# Patient Record
Sex: Male | Born: 1980 | Race: White | Hispanic: No | Marital: Single
Health system: Southern US, Community
[De-identification: ages and names within clinical notes are randomized; demographics above are authoritative.]

---

## 2016-08-17 ENCOUNTER — Emergency Department
Admission: EM | Admit: 2016-08-17 | Discharge: 2016-08-17 | Disposition: A | Discharge status: Home / Self Care | Payer: BC Managed Care – PPO | Attending: Emergency Medicine | Admitting: Emergency Medicine

## 2016-08-17 ENCOUNTER — Emergency Department: Payer: BC Managed Care – PPO

## 2016-08-17 DIAGNOSIS — W268XXA Contact with other sharp object(s), not elsewhere classified, initial encounter: Secondary | ICD-10-CM | POA: Diagnosis not present

## 2016-08-17 DIAGNOSIS — Y9389 Activity, other specified: Secondary | ICD-10-CM | POA: Insufficient documentation

## 2016-08-17 DIAGNOSIS — Y999 Unspecified external cause status: Secondary | ICD-10-CM | POA: Diagnosis not present

## 2016-08-17 DIAGNOSIS — S61412A Laceration without foreign body of left hand, initial encounter: Secondary | ICD-10-CM | POA: Diagnosis not present

## 2016-08-17 DIAGNOSIS — Y929 Unspecified place or not applicable: Secondary | ICD-10-CM | POA: Insufficient documentation

## 2016-08-17 MED ORDER — TRAMADOL HCL 50 MG PO TABS: 50.0000 mg | ORAL_TABLET | Freq: Four times a day (QID) | ORAL | 0 refills | Status: AC | PRN

## 2016-08-17 MED ORDER — TRAMADOL HCL 50 MG PO TABS
50.0000 mg | ORAL_TABLET | Freq: Once | ORAL | Status: AC
  Administered 2016-08-17: 50 mg via ORAL
  Filled 2016-08-17: qty 1

## 2016-08-17 NOTE — ED Provider Notes (Signed)
[facility] Emergency Department Provider Note  ____________________________________________  Time seen: Approximately 7:56 PM  I have reviewed the triage vital signs and the nursing notes.   HISTORY  Chief Complaint Laceration   HPI Thomas Wilson is a 35 y.o. male that presents with laceration to left hand. A chisel cut hand earlier tonight while making a box. Patient had tetanus shot a couple months ago. Patient has full range of motion of fingers. Patient is having some numbness of left ring finger. Patient is right handed. Patient denies any other injuries.  No past medical history on file.  There are no active problems to display for this patient.   No past surgical history on file.  Prior to Admission medications   Medication Sig Start Date End Date Taking? Authorizing Provider  traMADol (ULTRAM) 50 MG tablet Take 1 tablet (50 mg total) by mouth every 6 (six) hours as needed. 08/17/16 08/17/17  Enid DerryAshley Chrissy Ealey, PA-C    Allergies Patient has no known allergies.  No family history on file.  Social History Social History  Substance Use Topics  . Smoking status: Not on file  . Smokeless tobacco: Not on file  . Alcohol use Not on file    Review of Systems  Constitutional: Negative for fever/chills Cardiovascular: No chest pain Respiratory: Negative for shortness of breath. Musculoskeletal: Positive for pain. Skin: Laceration to palm of left hand. No swelling or bruising.  ____________________________________________   PHYSICAL EXAM:  VITAL SIGNS: ED Triage Vitals  Enc Vitals Group     BP 08/17/16 1948 (!) 124/57     Pulse Rate 08/17/16 1948 64     Resp 08/17/16 1948 18     Temp 08/17/16 1948 97.9 F (36.6 C)     Temp Source 08/17/16 1948 Oral     SpO2 08/17/16 1948 100 %     Weight 08/17/16 1927 200 lb (90.7 kg)     Height 08/17/16 1927 6' (1.829 m)     Head Circumference --      Peak Flow --      Pain Score 08/17/16 1926 9      Pain Loc --      Pain Edu? --      Excl. in GC? --      Constitutional: Alert and oriented. Well appearing and in no acute distress. Eyes: Conjunctivae are normal. EOMI. Nose: No congestion/rhinnorhea. Mouth/Throat: Mucous membranes are moist.   Neck: No stridor. Cardiovascular: Good peripheral circulation. Respiratory: Normal respiratory effort.  No retractions. Musculoskeletal: FROM throughout. Neurologic:  Normal speech and language. No gross focal neurologic deficits are appreciated. Skin:  4 cm laceration to palm of left hand.   ____________________________________________   LABS (all labs ordered are listed, but only abnormal results are displayed)  Labs Reviewed - No data to display ____________________________________________  EKG    RADIOLOGY  I, Enid DerryAshley Cambria Osten, personally viewed and evaluated these images (plain radiographs) as part of my medical decision making, as well as reviewing the written report by the radiologist.   FINDINGS:  There is no evidence of acute fracture or dislocation. Bone  mineralization appears normal. Joint space widths are preserved.  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 No soft tissue  abnormality or radiopaque foreign body is identified.    IMPRESSION:  No acute osseous abnormality or radiopaque foreign body identified.      PROCEDURES  Procedure(s) performed:  LACERATION REPAIR Performed by: Enid DerryAshley Maudean Hoffmann  Consent: Verbal consent obtained.  Consent given by:  patient  Prepped and Draped in normal sterile fashion  Wound explored: No foreign bodies   Laceration Location: Palm of left hand  Laceration Length: 4 cm  Anesthesia: None  Local anesthetic: lidocaine 1% w/o epinephrine. Hospital out of lidocaine with epinephrine  Anesthetic total: 5 ml  Irrigation method: syringe  Amount of cleaning: 500ml normal saline  Skin closure: 4-0 nylon  Number of sutures: 8  Technique: Simple  interrupted  Patient tolerance: Patient tolerated the procedure well with no immediate complications.     INITIAL IMPRESSION / ASSESSMENT AND PLAN / ED COURSE  Clinical Course     Pertinent labs & imaging results that were available during my care of the patient were reviewed by me and considered in my medical decision making (see chart for details).  Patient has laceration to left hand with some numbness of left ring finger. No foreign objects or broken bones identified on xray. Patient was given copy of xrays. Patient already aware of boutonniere deformity of pinky finger.  Patient declined hand splint due to cost and agreed to buy splint over-the-counter to help keep wound closed. Patient was educated about wound care. Patient was instructed to follow-up with hand surgery. Patient was instructed to return in 10 days for suture removal. Patient was advised to return to the ED for any signs of infection.  ____________________________________________   FINAL CLINICAL IMPRESSION(S) / ED DIAGNOSES  Final diagnoses:  Laceration of left hand, foreign body presence unspecified, initial encounter    New Prescriptions   TRAMADOL (ULTRAM) 50 MG TABLET    Take 1 tablet (50 mg total) by mouth every 6 (six) hours as needed.    Note:  This document was prepared using Dragon voice recognition software and may include unintentional dictation errors.   Enid DerryAshley Azzure Garabedian, PA-C 08/17/16 2239    Enid DerryAshley Mahir Prabhakar, PA-C 08/17/16 2308    Sharman CheekPhillip Stafford, MD 08/17/16 [id number]

## 2016-08-17 NOTE — ED Triage Notes (Signed)
Patient ambulatory to triage with steady gait, without difficulty or distress noted; pt reports puncture to left palm from chisel while working on presents; gauze dressing applied; small amount bleeding noted

## 2016-08-17 NOTE — Discharge Instructions (Signed)
Buy hand splint over the counter. Return to ED for fever, swelling, redness.

## 2017-09-13 IMAGING — DX DG HAND COMPLETE 3+V*L*
3 series · 3 of 3 positions shown · non-contrast
Comparison: None.

CLINICAL DATA: Puncture injury to the left palm from a chisel.
Initial encounter.

EXAM:
LEFT HAND - COMPLETE 3+ VIEW

[hand ap]
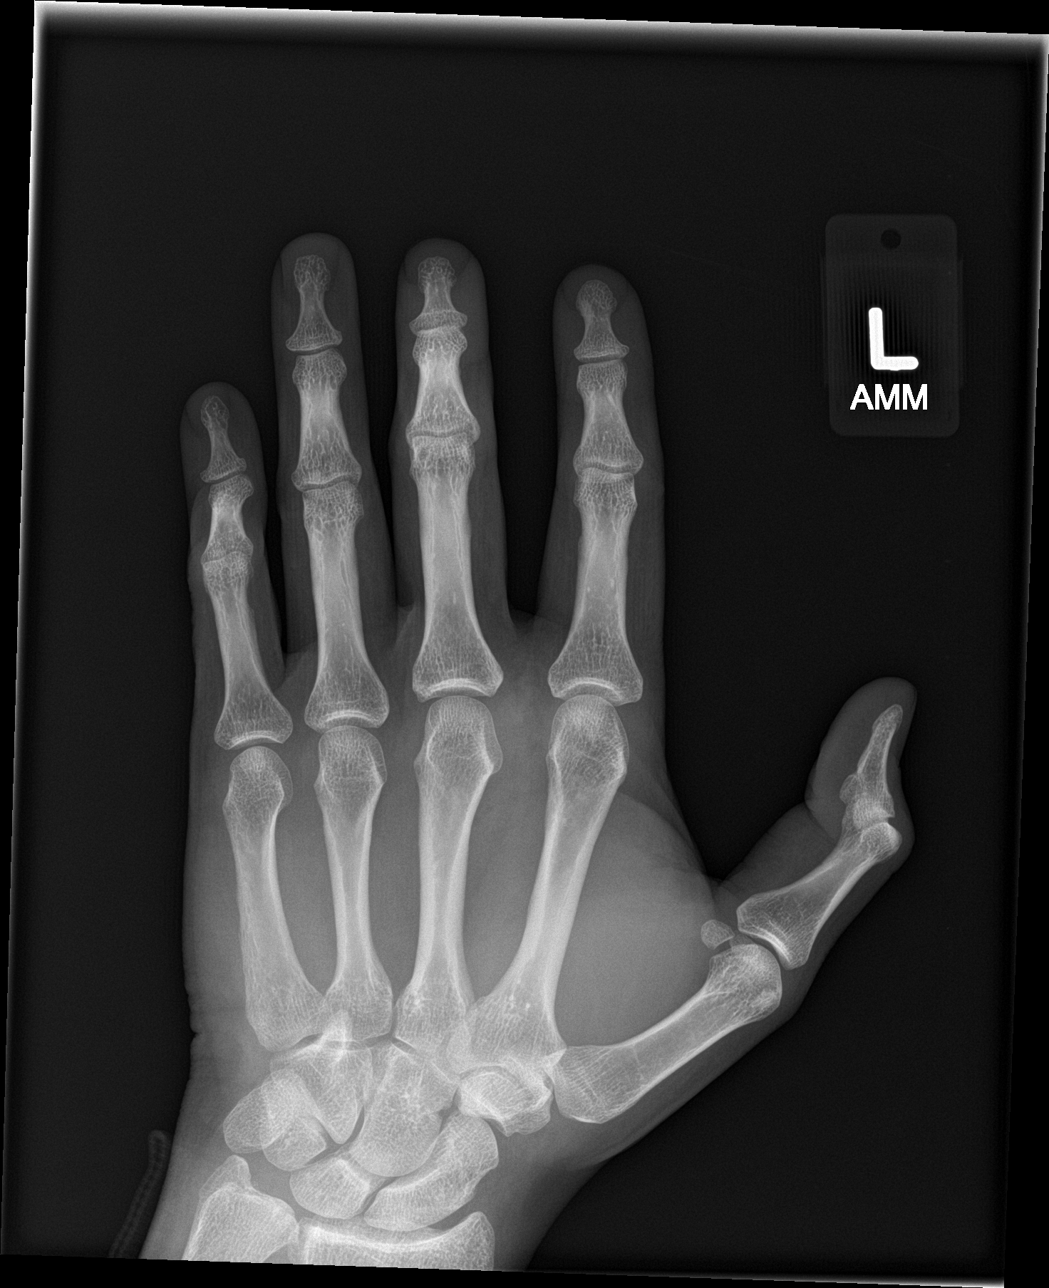

[hand obl]
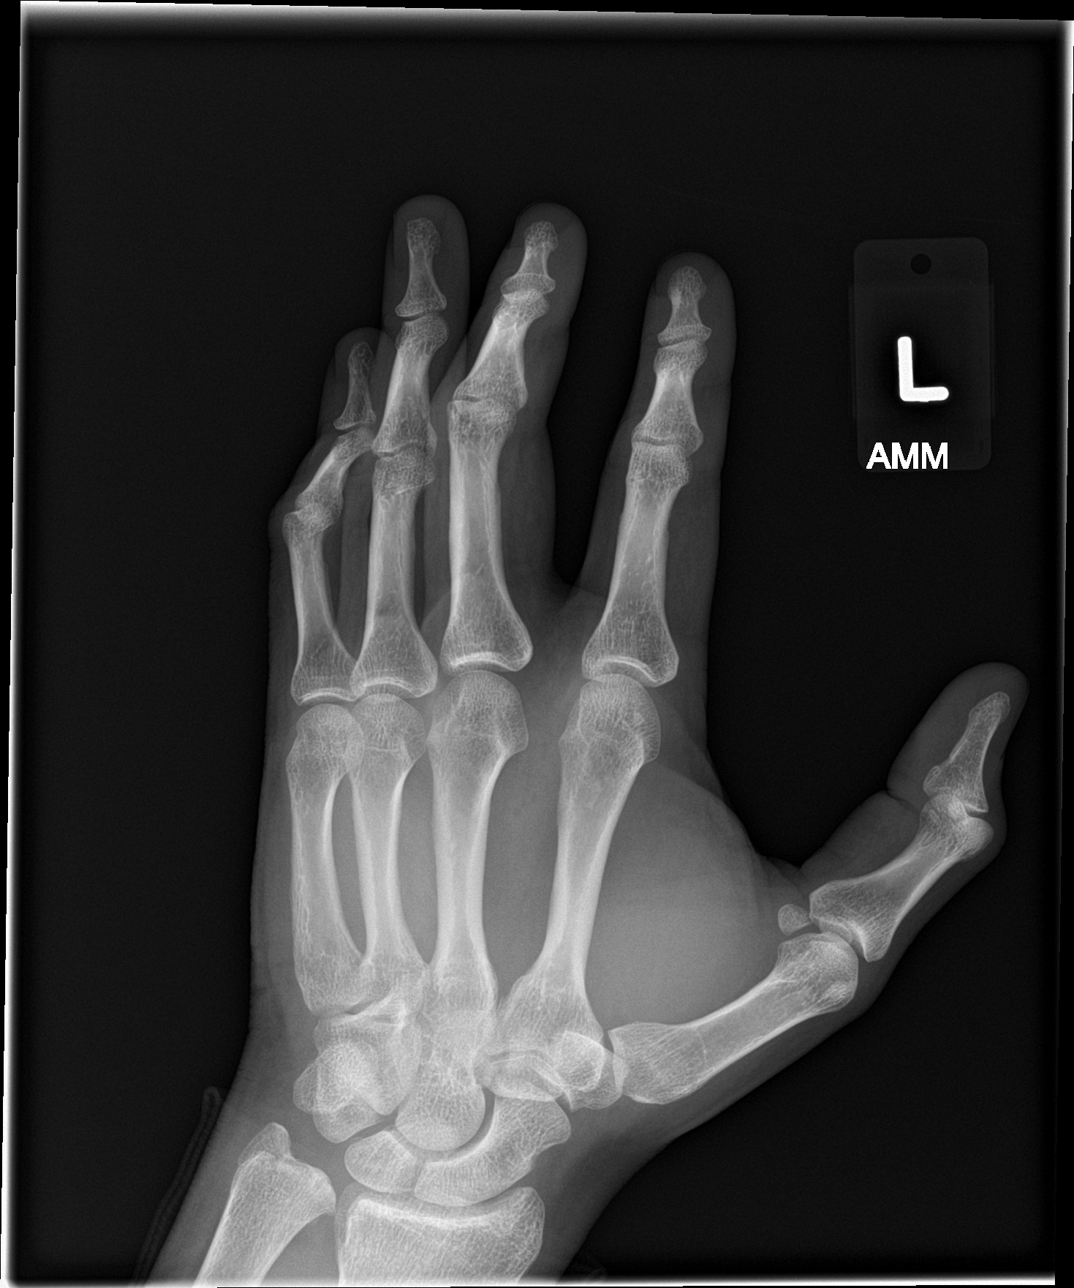

[hand lat]
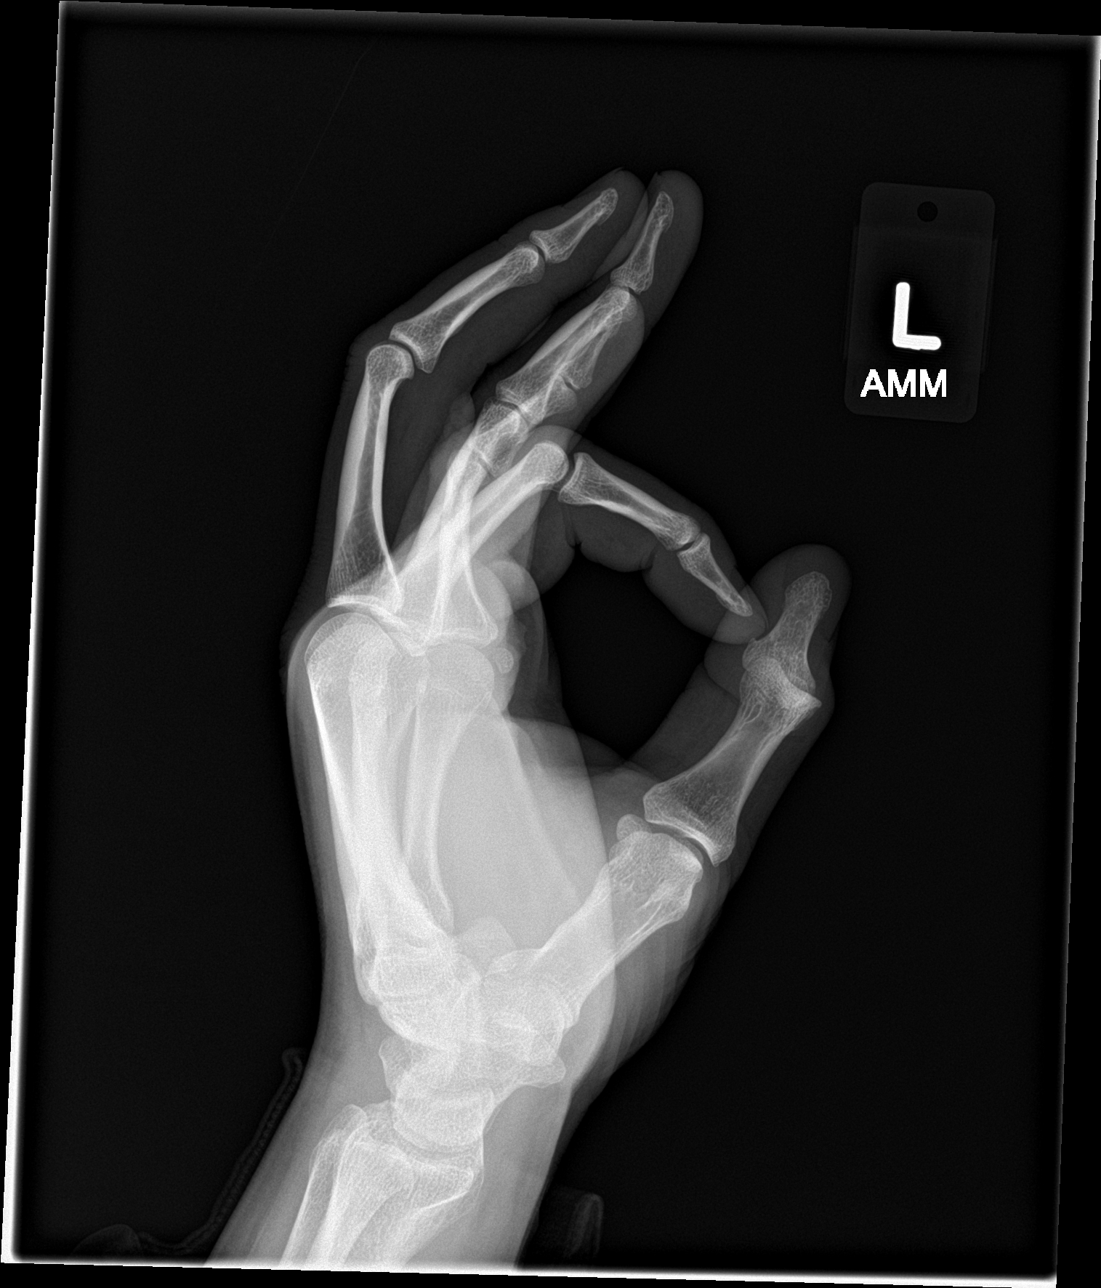

[3 of 3 positions shown; findings below may reference images not displayed]

FINDINGS: There is no evidence of acute fracture or dislocation. Bone
mineralization appears normal. Joint space widths are preserved.
Mild boutonniere deformity of the small finger. No soft tissue
abnormality or radiopaque foreign body is identified.
IMPRESSION: No acute osseous abnormality or radiopaque foreign body identified.
